# Patient Record
Sex: Male | Born: 1985 | Race: Black or African American | Hispanic: No | Marital: Single
Health system: Southern US, Community
[De-identification: ages and names within clinical notes are randomized; demographics above are authoritative.]

## PROBLEM LIST (undated history)

## (undated) DIAGNOSIS — R569 Unspecified convulsions: Secondary | ICD-10-CM

## (undated) HISTORY — PX: CARDIAC SURGERY: SHX584

---

## 2017-12-04 ENCOUNTER — Emergency Department (HOSPITAL_COMMUNITY): Payer: BLUE CROSS/BLUE SHIELD

## 2017-12-04 ENCOUNTER — Other Ambulatory Visit: Payer: Self-pay

## 2017-12-04 ENCOUNTER — Encounter (HOSPITAL_COMMUNITY): Payer: Self-pay

## 2017-12-04 ENCOUNTER — Emergency Department (HOSPITAL_COMMUNITY)
Admission: EM | Admit: 2017-12-04 | Discharge: 2017-12-04 | Disposition: A | Discharge status: Home / Self Care | Payer: BLUE CROSS/BLUE SHIELD | Attending: Emergency Medicine | Admitting: Emergency Medicine

## 2017-12-04 DIAGNOSIS — Y9389 Activity, other specified: Secondary | ICD-10-CM | POA: Diagnosis not present

## 2017-12-04 DIAGNOSIS — Y999 Unspecified external cause status: Secondary | ICD-10-CM | POA: Diagnosis not present

## 2017-12-04 DIAGNOSIS — Z7982 Long term (current) use of aspirin: Secondary | ICD-10-CM | POA: Diagnosis not present

## 2017-12-04 DIAGNOSIS — S20212A Contusion of left front wall of thorax, initial encounter: Secondary | ICD-10-CM | POA: Insufficient documentation

## 2017-12-04 DIAGNOSIS — Y92410 Unspecified street and highway as the place of occurrence of the external cause: Secondary | ICD-10-CM | POA: Diagnosis not present

## 2017-12-04 DIAGNOSIS — Z79899 Other long term (current) drug therapy: Secondary | ICD-10-CM | POA: Diagnosis not present

## 2017-12-04 DIAGNOSIS — R569 Unspecified convulsions: Secondary | ICD-10-CM | POA: Diagnosis present

## 2017-12-04 DIAGNOSIS — Z91010 Allergy to peanuts: Secondary | ICD-10-CM | POA: Diagnosis not present

## 2017-12-04 DIAGNOSIS — S00512A Abrasion of oral cavity, initial encounter: Secondary | ICD-10-CM | POA: Insufficient documentation

## 2017-12-04 HISTORY — DX: Unspecified convulsions: R56.9

## 2017-12-04 LAB — URINALYSIS, ROUTINE W REFLEX MICROSCOPIC
Bilirubin Urine: NEGATIVE
GLUCOSE, UA: NEGATIVE mg/dL
Hgb urine dipstick: NEGATIVE
Ketones, ur: NEGATIVE mg/dL
LEUKOCYTES UA: NEGATIVE
Nitrite: NEGATIVE
PROTEIN: NEGATIVE mg/dL
Specific Gravity, Urine: 1.013 (ref 1.005–1.030)
pH: 7 (ref 5.0–8.0)

## 2017-12-04 LAB — CBC WITH DIFFERENTIAL/PLATELET
ABS IMMATURE GRANULOCYTES: 0.02 10*3/uL (ref 0.00–0.07)
Basophils Absolute: 0.1 10*3/uL (ref 0.0–0.1)
Basophils Relative: 1 %
Eosinophils Absolute: 0 10*3/uL (ref 0.0–0.5)
Eosinophils Relative: 1 %
HCT: 43 % (ref 39.0–52.0)
Hemoglobin: 14.6 g/dL (ref 13.0–17.0)
IMMATURE GRANULOCYTES: 0 %
LYMPHS ABS: 1 10*3/uL (ref 0.7–4.0)
LYMPHS PCT: 14 %
MCH: 28.2 pg (ref 26.0–34.0)
MCHC: 34 g/dL (ref 30.0–36.0)
MCV: 83.2 fL (ref 80.0–100.0)
Monocytes Absolute: 0.9 10*3/uL (ref 0.1–1.0)
Monocytes Relative: 13 %
NEUTROS ABS: 5.3 10*3/uL (ref 1.7–7.7)
Neutrophils Relative %: 71 %
PLATELETS: 267 10*3/uL (ref 150–400)
RBC: 5.17 MIL/uL (ref 4.22–5.81)
RDW: 12.8 % (ref 11.5–15.5)
WBC: 7.4 10*3/uL (ref 4.0–10.5)
nRBC: 0 % (ref 0.0–0.2)

## 2017-12-04 LAB — BASIC METABOLIC PANEL
ANION GAP: 7 (ref 5–15)
BUN: 10 mg/dL (ref 6–20)
CHLORIDE: 109 mmol/L (ref 98–111)
CO2: 26 mmol/L (ref 22–32)
Calcium: 9.4 mg/dL (ref 8.9–10.3)
Creatinine, Ser: 0.84 mg/dL (ref 0.61–1.24)
GFR calc Af Amer: >60 mL/min (ref >60)
GFR calc non Af Amer: >60 mL/min (ref >60)
GLUCOSE: 109 mg/dL — AB (ref 70–99)
POTASSIUM: 3.2 mmol/L — AB (ref 3.5–5.1)
Sodium: 142 mmol/L (ref 135–145)

## 2017-12-04 LAB — CBG MONITORING, ED: Glucose-Capillary: 94 mg/dL (ref 70–99)

## 2017-12-04 MED ORDER — LIDOCAINE VISCOUS HCL 2 % MT SOLN
15.0000 mL | Freq: Once | OROMUCOSAL | Status: AC
  Administered 2017-12-04: 15 mL via OROMUCOSAL
  Filled 2017-12-04: qty 15

## 2017-12-04 MED ORDER — SODIUM CHLORIDE 0.9 % IV SOLN
INTRAVENOUS | Status: DC
  Administered 2017-12-04: 18:00:00 via INTRAVENOUS

## 2017-12-04 MED ORDER — LORAZEPAM 2 MG/ML IJ SOLN
1.0000 mg | Freq: Once | INTRAMUSCULAR | Status: AC
  Administered 2017-12-04: 1 mg via INTRAVENOUS
  Filled 2017-12-04: qty 1

## 2017-12-04 MED ORDER — SODIUM CHLORIDE 0.9 % IV BOLUS
1000.0000 mL | Freq: Once | INTRAVENOUS | Status: AC
  Administered 2017-12-04: 1000 mL via INTRAVENOUS
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
## 2017-12-04 NOTE — ED Notes (Signed)
Patient transported to x-ray. ?
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
## 2017-12-04 NOTE — Discharge Instructions (Addendum)
No driving or operating heavy machinery until cleared by your neurologist.
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
